# Patient Record
Sex: Female | Born: 1995 | Race: White | Hispanic: No | Marital: Single | Smoking: Never smoker
Health system: Southern US, Community
[De-identification: ages and names within clinical notes are randomized; demographics above are authoritative.]

---
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
## 2015-01-16 ENCOUNTER — Ambulatory Visit
Admission: RE | Admit: 2015-01-16 | Discharge: 2015-01-16 | Disposition: A | Discharge status: Home / Self Care | Payer: 59 | Source: Ambulatory Visit | Attending: Family Medicine | Admitting: Family Medicine

## 2015-01-16 ENCOUNTER — Other Ambulatory Visit: Payer: Self-pay | Admitting: Family Medicine

## 2015-01-16 DIAGNOSIS — M25511 Pain in right shoulder: Secondary | ICD-10-CM

## 2015-01-27 ENCOUNTER — Encounter: Payer: Self-pay | Admitting: Internal Medicine

## 2015-01-27 ENCOUNTER — Ambulatory Visit (INDEPENDENT_AMBULATORY_CARE_PROVIDER_SITE_OTHER): Payer: 59 | Admitting: Internal Medicine

## 2015-01-27 VITALS — BP 120/60 | HR 55 | Temp 97.8°F | Resp 12 | Ht 64.0 in | Wt 131.0 lb

## 2015-01-27 DIAGNOSIS — R946 Abnormal results of thyroid function studies: Secondary | ICD-10-CM

## 2015-01-27 DIAGNOSIS — E041 Nontoxic single thyroid nodule: Secondary | ICD-10-CM

## 2015-01-27 DIAGNOSIS — R9389 Abnormal findings on diagnostic imaging of other specified body structures: Secondary | ICD-10-CM

## 2015-01-27 DIAGNOSIS — E063 Autoimmune thyroiditis: Secondary | ICD-10-CM | POA: Insufficient documentation

## 2015-01-27 LAB — T4, FREE: Free T4: 1.07 ng/dL (ref 0.60–1.60)

## 2015-01-27 LAB — TSH: TSH: 2 u[IU]/mL (ref 0.40–5.00)

## 2015-01-27 LAB — T3, FREE: T3, Free: 3.6 pg/mL (ref 2.3–4.2)

## 2015-01-27 MED ORDER — LORAZEPAM 0.5 MG PO TABS: 0.5000 mg | ORAL_TABLET | Freq: Two times a day (BID) | ORAL | Status: DC | PRN

## 2015-01-27 NOTE — Progress Notes (Addendum)
Patient ID: Erica Forbes, female   DOB: [birth year], 19 y.o.   MRN: [id number]   HPI  Erica Forbes is a 26 y.o.-year-old female, referred by Dr Gerrit Friends, for management of a thyroid nodule and suspicion for thyroiditis (thyroid heterogeneity on the ultrasound).  Patient describes that last year she noted that any physical activity that increased her heart rate would cause pain in her neck and R shoulder >> saw Sports medicine >> chest CT angiogram in 12/2014 showed a right thyroid nodule.  Thyroid U/S (01/05/2014): 2.5 x 1.4 x 1.57 complex solid and cystic nodule in the lower pole with possible microcalcifications.  Thyroid nodule Bx (04/19/2014): benign  Thyroid U/S (10/06/2014): Dominant right-sided nodule at the inferior right thyroid measures 2.7 cm x 1.9 cm x 2.0 cm. Complex features with cystic and solid components. Heterogeneous appearance of L thyroid lobe.  Pt was seen by Dr Gerrit Friends but no surgery was indicated >> he referred the pt to endocrinology.  Pt denies feeling nodules in neck, hoarseness, dysphagia/odynophagia, SOB with lying down. She only feels right neck and shoulder pain when she exercises. She therefore reduced her activity level.  I reviewed pt's thyroid tests: Lab Results  Component Value Date   TSH 1.880 01/05/2014    Pt denies: - heat intolerance/cold intolerance - tremors - palpitations - anxiety/depression - hyperdefecation/constipation - weight loss - weight gain - dry skin - hair loss - problems with concentration - fatigue  Pt does not have a FH of thyroid ds or autoimmune ds. No FH of thyroid cancer. No h/o radiation tx to head or neck.  No seaweed or kelp, no recent contrast studies. No steroid use. No herbal supplements. Took Biotin in the past, not recently  I reviewed her chart and she also has a history of ear tubes as a child.  ROS: Constitutional: no weight gain/loss, no fatigue, no subjective hyperthermia/hypothermia Eyes: no  blurry vision, no xerophthalmia ENT: no sore throat, no nodules palpated in throat, no dysphagia/odynophagia, no hoarseness Cardiovascular: no CP/SOB/palpitations/leg swelling Respiratory: no cough/SOB Gastrointestinal: no N/V/D/C Musculoskeletal: no muscle/joint aches Skin: no rashes Neurological: no tremors/numbness/tingling/dizziness Psychiatric: no depression/anxiety  No past medical history.  Past surgical history: - Ear tubes as a child  Social History   Social History  . Marital Status: Single    Spouse Name: N/A  . Number of Children: 0   Occupational History  .  student at [employer]   Social History Main Topics  . Smoking status: Never Smoker   . Smokeless tobacco: Current User  . Alcohol Use: No  . Drug Use: No   Social History Narrative   Current Outpatient Rx  Name  Route  Sig  Dispense  Refill  . etonogestrel-ethinyl estradiol (NUVARING) 0.12-0.015 MG/24HR vaginal ring   Vaginal   Place 1 each vaginally every 28 (twenty-eight) days. Insert vaginally and leave in place for 3 consecutive weeks, then remove for 1 week.         No Known Allergies   Family history: - Skin cancer in grandmother, otherwise see history of present illness   PE: BP 120/60 mmHg  Pulse 55  Temp(Src) 97.8 F (36.6 C) (Oral)  Resp 12  Ht 5\' 4"  (1.626 m)  Wt 131 lb (59.421 kg)  BMI 22.47 kg/m2  SpO2 99%  LMP  Wt Readings from Last 3 Encounters:  01/27/15 131 lb (59.421 kg) (58 %*, Z = 0.20)  12/30/13 125 lb (56.7 kg) (52 %*, Z = 0.04)  12/23/13 127 lb 1.6 oz (57.652 kg) (56 %*, Z = 0.15)   * Growth percentiles are based on CDC 2-20 Years data.   Constitutional: Normal weight, in NAD Eyes: PERRLA, EOMI, no exophthalmos ENT: moist mucous membranes, + visible right thyroid nodule when patient swallows, + right thyroid fullness, no cervical lymphadenopathy Cardiovascular: RRR, No MRG Respiratory: CTA B Gastrointestinal: abdomen soft, NT, ND, BS+ Musculoskeletal: no  deformities, strength intact in all 4;  Skin: moist, warm, no rashes Neurological: no tremor with outstretched hands, DTR normal in all 4  ASSESSMENT: 1. Right thyroid nodule   2. 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 Right thyroid nodule  - I reviewed the images of her thyroid ultrasound along with the patient and her mother. I pointed out that the right nodule is large, and contains a large amount of fluid (colloid). Accumulation and resorption of fluid can cause fluctuation in the nodule size, and I believe that this is the reason why her nodule appears to have grown on the last ultrasound. Also, fluctuating fluid volumes can cause varying degrees of neck compression symptoms. Patient feels discomfort in the right side of her neck and shoulder when she exercises, and this may be related to the proximity of the nodule to the right internal carotid. - We discussed that the biopsy of the nodule is very assuring, as it returned benign, the appearance of the nodule on the ultrasound also suggests benignity and the fact that she does not have a thyroid cancer family history or a personal history of RxTx to head/neck would all favor benignity. Therefore, the indication for intervention is only depending on her level of discomfort. - The patient is bothered by her nodule as she gets anxious when she tries to exercise and starts having the right shoulder and neck pain. - In this case, I suggested that we aspirate the nodule and see if her symptoms improve. If he does, we can follow her without any other intervention for now. If the fluid re-forms, she may need a repeat aspiration, and may need ethanol injection in the future versus hemithyroidectomy. I did explain that, while thyroid surgery is not a complicated one, it still can have side effects and also she might have a risk of ~25% of becoming hypothyroid after hemithyroidectomy. She is definitely more interested in ethanol injection then  surgery, however, will need to find the center that performs this procedure nearby. - patient decided to have the the aspiration done now >> I ordered this. As she is anxious about needles, I gave her a few tablets of Ativan for the day of the procedure and the day before.  - I'll see her back in a year, assuming her symptoms resolve. If they reappear, we'll meet sooner. - I advised pt to join my chart and I will send her the results through there   2. Thyroid heterogeneity on ultrasound - I explained that this can be a sign of Hashimoto thyroiditis - Today we will check TFTs and TPO Abs - we discussed about Hashimoto thyroiditis and the fact that we would only treat this if her TFTs were abnormal  Office Visit on 01/27/2015  Component Date Value Ref Range Status  . TSH 01/27/2015 2.00  0.40 - 5.00 uIU/mL Final  . Free T4 01/27/2015 1.07  0.60 - 1.60 ng/dL Final  . T3, Free 16/02/9603 3.6  2.3 - 4.2 pg/mL Final  . Thyroperoxidase Ab SerPl-aCnc 01/27/2015 75* <9 IU/mL Final   TFTs  normal, but she has a new diagnosis of Hashimoto's thyroiditis.  CLINICAL DATA: 19 year old with a biopsy-proven benign right thyroid nodule. Patient has symptoms and pain in her right lower neck/ supraclavicular region during exercise. There is concern about mass effect from the thyroid nodule. Request for thyroid nodule aspiration to relieve symptoms.  EXAM: THYROID ULTRASOUND  TECHNIQUE: Ultrasound examination of the thyroid gland and adjacent soft tissues was performed.  COMPARISON: 10/05/2014  FINDINGS: Again noted is a complex nodule in the right thyroid lobe. There is only a small fluid component to this complex nodule. Central aspect of the nodule is vascular. Previously, the nodule measured up to 2.7 cm. The nodule appears to be slightly smaller, measuring roughly 2.3 cm in greatest diameter.  IMPRESSION: Right thyroid nodule is complex but contains only a small amount of fluid. I  discussed ultrasound-guided aspiration with the patient and her mother in depth. Based on the small amount of fluid in this complex nodule, I did not feel the patient would get therapeutic benefit from the aspiration. In fact, due to the vascularity of this nodule, I was concerned there may be increased hemorrhage in the nodule following an aspiration. As a result, ultrasound-guided aspiration was not performed.   Electronically Signed By: Richarda Overlie M.D. On: 03/07/2015 18:36  Thyroid nodule draining not performed - see above. The nodule is smaller now, off to the previous biopsy, at 2.3 cm. There is not a clear indication for surgery, but will discuss with the patient and see if she prefers to follow the nodule or prefers a more aggressive approach, with right lobectomy, depending on her level of discomfort.

## 2015-01-27 NOTE — Patient Instructions (Addendum)
Please schedule a thyroid aspiration.  Take 1-2 tablets of Ativan in the day of the biopsy.  Please return in 1 year.

## 2015-01-28 LAB — THYROID PEROXIDASE ANTIBODY: Thyroperoxidase Ab SerPl-aCnc: 75 IU/mL — ABNORMAL HIGH (ref <9)

## 2015-03-07 ENCOUNTER — Other Ambulatory Visit: Payer: Self-pay | Admitting: Internal Medicine

## 2015-03-07 ENCOUNTER — Ambulatory Visit
Admission: RE | Admit: 2015-03-07 | Discharge: 2015-03-07 | Disposition: A | Discharge status: Home / Self Care | Payer: 59 | Source: Ambulatory Visit | Attending: Internal Medicine | Admitting: Internal Medicine

## 2015-03-07 DIAGNOSIS — E041 Nontoxic single thyroid nodule: Secondary | ICD-10-CM

## 2015-03-08 ENCOUNTER — Telehealth: Payer: Self-pay | Admitting: Internal Medicine

## 2015-03-08 NOTE — Telephone Encounter (Signed)
Patient is returning your call.  

## 2015-03-09 ENCOUNTER — Encounter: Payer: Self-pay | Admitting: *Deleted

## 2015-03-09 NOTE — Telephone Encounter (Signed)
Spoke with pt and advised her per Dr Charlean SanfilippoGherghe's result note. Pt voiced understanding.

## 2015-11-22 ENCOUNTER — Other Ambulatory Visit: Payer: Self-pay | Admitting: Surgery

## 2015-11-22 DIAGNOSIS — E041 Nontoxic single thyroid nodule: Secondary | ICD-10-CM

## 2016-01-18 ENCOUNTER — Telehealth: Payer: Self-pay | Admitting: Internal Medicine

## 2016-01-18 ENCOUNTER — Other Ambulatory Visit: Payer: Self-pay

## 2016-01-18 DIAGNOSIS — E041 Nontoxic single thyroid nodule: Secondary | ICD-10-CM

## 2016-01-18 NOTE — Telephone Encounter (Signed)
Okay to order a TSH, free T4, free T3.

## 2016-01-18 NOTE — Telephone Encounter (Signed)
Ordered labs per MD. Jeanene Erballed to get patient an appointment to come get labs drawn as soon as she can before her 9/5 appointment. Lab orders are in.

## 2016-01-18 NOTE — Telephone Encounter (Signed)
Pt is having symptoms lethargic and really tired for the last 6 months and is experiencing dandruff and very dry skin  She has an appt on 9/5 but wants to know if maybe she could have labs before this appt

## 2016-01-23 ENCOUNTER — Other Ambulatory Visit (INDEPENDENT_AMBULATORY_CARE_PROVIDER_SITE_OTHER): Payer: 59

## 2016-01-23 DIAGNOSIS — E041 Nontoxic single thyroid nodule: Secondary | ICD-10-CM

## 2016-01-23 LAB — T3, FREE: T3, Free: 3.4 pg/mL (ref 2.3–4.2)

## 2016-01-23 LAB — T4, FREE: Free T4: 0.88 ng/dL (ref 0.60–1.60)

## 2016-01-23 LAB — TSH: TSH: 1.3 u[IU]/mL (ref 0.35–5.50)

## 2016-01-24 ENCOUNTER — Telehealth: Payer: Self-pay

## 2016-01-24 NOTE — Telephone Encounter (Signed)
Called and left message for patient advised of  Normal results, gave call back number if any issues

## 2016-01-30 ENCOUNTER — Encounter: Payer: Self-pay | Admitting: Internal Medicine

## 2016-01-30 ENCOUNTER — Ambulatory Visit (INDEPENDENT_AMBULATORY_CARE_PROVIDER_SITE_OTHER): Payer: 59 | Admitting: Internal Medicine

## 2016-01-30 VITALS — BP 98/68 | HR 92 | Ht 64.5 in | Wt 130.0 lb

## 2016-01-30 DIAGNOSIS — E041 Nontoxic single thyroid nodule: Secondary | ICD-10-CM

## 2016-01-30 DIAGNOSIS — E063 Autoimmune thyroiditis: Secondary | ICD-10-CM | POA: Diagnosis not present

## 2016-01-30 NOTE — Progress Notes (Signed)
Patient ID: Erica BrazenHannah M Langlais, female   DOB: 02-Nov-1995, 20 y.o.   MRN: [id number]   HPI  Erica Forbes is a 20 y.o.-year-old female, initially referred by Dr Gerrit FriendsGerkin, now returning for follow-up for a thyroid nodule and Hashimoto's thyroiditis.  Reviewed history: Patient described that in 2015 she noted that any physical activity that increased her heart rate would cause pain in her neck and R shoulder >> saw Sports medicine >> chest CT angiogram in 12/2014 showed a right thyroid nodule.  Thyroid U/S (01/05/2014): 2.5 x 1.4 x 1.57 complex solid and cystic nodule in the lower pole with possible microcalcifications.  Thyroid nodule Bx (04/19/2014): benign  Thyroid U/S (10/06/2014): Dominant right-sided nodule at the inferior right thyroid measures 2.7 cm x 1.9 cm x 2.0 cm. Complex features with cystic and solid components. Heterogeneous appearance of L thyroid lobe.  Pt was seen by Dr Gerrit FriendsGerkin but no surgery was indicated >> he referred the pt to endocrinology.  Thyroid U/S (03/07/2015): Complex right thyroid nodule, which appeared smaller in size (2.3 cm), and with less colloid compared to before. Therefore, another aspiration was not employed.  Pt denies feeling nodules in neck, hoarseness, dysphagia/odynophagia, SOB with lying down. She only feels right neck and shoulder pain when she exercises. She therefore reduced her activity level.  I reviewed pt's thyroid tests: Lab Results  Component Value Date   TSH 1.30 01/23/2016   TSH 2.00 01/27/2015   TSH 1.880 01/05/2014   FREET4 0.88 01/23/2016   FREET4 1.07 01/27/2015    Pt denies: - heat intolerance/cold intolerance - tremors - palpitations - anxiety/depression - hyperdefecation/constipation - weight loss - weight gain - dry skin - hair loss - problems with concentration  She c/o: - fatigue - dry scalp  Pt does not have a FH of thyroid ds or autoimmune ds. No FH of thyroid cancer. No h/o radiation tx to head or  neck.  No seaweed or kelp, no recent contrast studies. No steroid use. No herbal supplements. Took Biotin in the past, not recently  I reviewed her chart and she also has a history of ear tubes as a child.  She changed the OCPs 4 days ago.  ROS: Constitutional: no weight gain/loss, + fatigue, no subjective hyperthermia/hypothermia Eyes: no blurry vision, no xerophthalmia ENT: no sore throat, no nodules palpated in throat, no dysphagia/odynophagia, no hoarseness Cardiovascular: no CP/SOB/palpitations/leg swelling Respiratory: no cough/SOB Gastrointestinal: no N/V/D/C Musculoskeletal: no muscle/joint aches Skin: no rashes Neurological: no tremors/numbness/tingling/dizziness  I reviewed pt's medications, allergies, PMH, social hx, family hx, and changes were documented in the history of present illness. Otherwise, unchanged from my initial visit note.  No past medical history.  Past surgical history: - Ear tubes as a child  Social History   Social History  . Marital Status: Single    Spouse Name: N/A  . Number of Children: 0   Occupational History  .  student at [employer]   Social History Main Topics  . Smoking status: Never Smoker   . Smokeless tobacco: Current User  . Alcohol Use: No  . Drug Use: No   Social History Narrative   Current Outpatient Rx  Name  Route  Sig  Dispense  Refill  . etonogestrel-ethinyl estradiol (NUVARING) 0.12-0.015 MG/24HR vaginal ring   Vaginal   Place 1 each vaginally every 28 (twenty-eight) days. Insert vaginally and leave in place for 3 consecutive weeks, then remove for 1 week.         No  Known Allergies   Family history: - Skin cancer in grandmother, otherwise see history of present illness   PE: BP 98/68 (BP Location: Left Arm, Patient Position: Sitting)   Pulse 92   Ht 5' 4.5" (1.638 m)   Wt 130 lb (59 kg)   SpO2 97%   BMI 21.97 kg/m  Wt Readings from Last 3 Encounters:  01/30/16 130 lb (59 kg)  01/27/15 131 lb  (59.4 kg) (58 %, Z= 0.20)*  12/30/13 125 lb (56.7 kg) (52 %, Z= 0.04)*   * Growth percentiles are based on CDC 2-20 Years data.   Constitutional: Normal weight, in NAD Eyes: PERRLA, EOMI, no exophthalmos ENT: moist mucous membranes, + visible right thyroid nodule when patient swallows, + right thyroid fullness, no cervical lymphadenopathy Cardiovascular: RRR, No MRG Respiratory: CTA B Gastrointestinal: abdomen soft, NT, ND, BS+ Musculoskeletal: no deformities, strength intact in all 4;  Skin: moist, warm, no rashes Neurological: no tremor with outstretched hands, DTR normal in all 4  ASSESSMENT: 1. Right thyroid nodule   2. Hashimoto's thyroiditis   PLAN: 1. Right thyroid nodule  - I reviewed the reports of her latest to thyroid ultrasounds along with the patient and her mother. I pointed out that this is a complex nodule, in which accumulation and resorption of fluid can cause fluctuation in the nodule size.Fluctuating fluid volumes can cause varying degrees of neck compression symptoms. Patient feels discomfort in the right side of her neck and shoulder when she exercises, and this may be related to the proximity of the nodule to the right internal carotid. - We discussed that the biopsy of the nodule is very assuring, as it returned benign, the appearance of the nodule on the ultrasound also suggests benignity and the fact that she does not have a thyroid cancer family history or a personal history of RxTx to head/neck would all favor benignity. Therefore, the indication for intervention is only depending on her level of discomfort. - The patient is bothered by her nodule when she tries to exercise and starts having the right shoulder and neck pain.  - We could not repeat the aspiration last year as there was not enough colloid, and therefore ethanol injection in the nodule would not help. She would like to avoid hemithyroidectomy. She and her mom tell me they have researched different  modalities to shrink thyroid nodules w/o surgery and would like to pursue Echo therapy (thermal U/S ablation). I will research this to see where this technique is done. I will let them know. - I'll see her back in a year, assuming her symptoms resolve. If they reappear, we'll meet sooner. - I advised pt to join my chart and I will send her the results through there   2.  Hashimoto thyroiditis - First detected test heterogeneity on thyroid ultrasound - TPO Abs were also found to be elevated - This is euthyroid for now with no need for levothyroxine  - She had recent TFTs that were normal  Carlus Pavlov, MD PhD [facility]

## 2016-01-30 NOTE — Patient Instructions (Signed)
Please return in 1 year. 

## 2016-11-25 ENCOUNTER — Emergency Department (HOSPITAL_COMMUNITY)
Admission: EM | Admit: 2016-11-25 | Discharge: 2016-11-26 | Disposition: A | Discharge status: Home / Self Care | Payer: BLUE CROSS/BLUE SHIELD | Attending: Emergency Medicine | Admitting: Emergency Medicine

## 2016-11-25 ENCOUNTER — Encounter (HOSPITAL_COMMUNITY): Payer: Self-pay | Admitting: Emergency Medicine

## 2016-11-25 ENCOUNTER — Emergency Department (HOSPITAL_COMMUNITY): Payer: BLUE CROSS/BLUE SHIELD

## 2016-11-25 DIAGNOSIS — S0003XA Contusion of scalp, initial encounter: Secondary | ICD-10-CM | POA: Diagnosis not present

## 2016-11-25 DIAGNOSIS — S02651A Fracture of angle of right mandible, initial encounter for closed fracture: Secondary | ICD-10-CM

## 2016-11-25 DIAGNOSIS — T07XXXA Unspecified multiple injuries, initial encounter: Secondary | ICD-10-CM | POA: Diagnosis not present

## 2016-11-25 DIAGNOSIS — R2231 Localized swelling, mass and lump, right upper limb: Secondary | ICD-10-CM | POA: Diagnosis not present

## 2016-11-25 DIAGNOSIS — Y939 Activity, unspecified: Secondary | ICD-10-CM | POA: Insufficient documentation

## 2016-11-25 DIAGNOSIS — Y999 Unspecified external cause status: Secondary | ICD-10-CM | POA: Diagnosis not present

## 2016-11-25 DIAGNOSIS — Y929 Unspecified place or not applicable: Secondary | ICD-10-CM | POA: Diagnosis not present

## 2016-11-25 DIAGNOSIS — R6884 Jaw pain: Secondary | ICD-10-CM | POA: Diagnosis not present

## 2016-11-25 LAB — POC URINE PREG, ED: PREG TEST UR: NEGATIVE

## 2016-11-25 MED ORDER — OXYCODONE-ACETAMINOPHEN 5-325 MG PO TABS
ORAL_TABLET | ORAL | Status: AC
  Filled 2016-11-25: qty 1

## 2016-11-25 MED ORDER — OXYCODONE-ACETAMINOPHEN 5-325 MG PO TABS
1.0000 | ORAL_TABLET | Freq: Once | ORAL | Status: AC
  Administered 2016-11-25: 1 via ORAL
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
## 2016-11-26 MED ORDER — CEPHALEXIN 250 MG PO CAPS
500.0000 mg | ORAL_CAPSULE | Freq: Once | ORAL | Status: AC
  Administered 2016-11-26: 500 mg via ORAL
  Filled 2016-11-26: qty 2

## 2016-11-26 MED ORDER — CEPHALEXIN 500 MG PO CAPS: 500.0000 mg | ORAL_CAPSULE | Freq: Three times a day (TID) | ORAL | 0 refills | Status: DC

## 2016-11-26 MED ORDER — IBUPROFEN 100 MG/5ML PO SUSP
400.0000 mg | Freq: Once | ORAL | Status: AC
  Administered 2016-11-26: 400 mg via ORAL
  Filled 2016-11-26: qty 20

## 2016-11-26 MED ORDER — HYDROCODONE-ACETAMINOPHEN 5-325 MG PO TABS: 1.0000 | ORAL_TABLET | Freq: Four times a day (QID) | ORAL | 0 refills | Status: DC | PRN

## 2016-11-26 MED ORDER — DICLOFENAC SODIUM 50 MG PO TBEC: 50.0000 mg | DELAYED_RELEASE_TABLET | Freq: Two times a day (BID) | ORAL | 0 refills | Status: DC

## 2016-11-26 NOTE — ED Notes (Signed)
PT states understanding of care given, follow up care, and medication prescribed. PT ambulated from ED to car with a steady gait. 

## 2016-11-26 NOTE — Discharge Instructions (Signed)
Call Dr. Maude Lerichehimmappa's office for follow up. Return here as needed.

## 2017-01-29 ENCOUNTER — Ambulatory Visit: Payer: Self-pay | Admitting: Internal Medicine

## 2017-03-20 ENCOUNTER — Encounter: Payer: Self-pay | Admitting: Internal Medicine

## 2017-03-20 ENCOUNTER — Ambulatory Visit (INDEPENDENT_AMBULATORY_CARE_PROVIDER_SITE_OTHER): Payer: BLUE CROSS/BLUE SHIELD | Admitting: Internal Medicine

## 2017-03-20 VITALS — BP 112/70 | HR 73 | Temp 98.0°F | Ht 64.5 in | Wt 133.0 lb

## 2017-03-20 DIAGNOSIS — E041 Nontoxic single thyroid nodule: Secondary | ICD-10-CM | POA: Diagnosis not present

## 2017-03-20 DIAGNOSIS — E063 Autoimmune thyroiditis: Secondary | ICD-10-CM

## 2017-03-20 LAB — TSH: TSH: 1.08 u[IU]/mL (ref 0.35–4.50)

## 2017-03-20 LAB — T3, FREE: T3, Free: 3.3 pg/mL (ref 2.3–4.2)

## 2017-03-20 LAB — T4, FREE: FREE T4: 0.84 ng/dL (ref 0.60–1.60)

## 2017-03-20 NOTE — Progress Notes (Signed)
Patient ID: Erica Forbes, female   DOB: 12-21-1995, 21 y.o.   MRN: [id number]   HPI  Erica Forbes is a 21 y.o.-year-old female, initially referred by Dr Erica Forbes, now returning for follow-up for a thyroid nodule and Hashimoto's thyroiditis. Last visit a year ago.  She had a jaw fx 11/2016.  She is under a lot of stress with school and work >> increased anxiety, palpitations, acne, bursts in tears. On OCPs for dysmenorrhea and acne.   Reviewed and addended history: Patient described that in 2015 she noted that any physical activity that increased her heart rate would cause pain in her neck and R shoulder >> saw Sports medicine >> chest CT angiogram in 12/2014 showed a right thyroid nodule.  Thyroid U/S (01/05/2014): 2.5 x 1.4 x 1.57 complex solid and cystic nodule in the lower pole with possible microcalcifications.  Thyroid nodule Bx (04/19/2014): benign  Thyroid U/S (10/06/2014): Dominant right-sided nodule at the inferior right thyroid measures 2.7 cm x 1.9 cm x 2.0 cm. Complex features with cystic and solid components. Heterogeneous appearance of L thyroid lobe.  Pt was seen by Dr Erica Forbes but no surgery was indicated >> he referred the pt to endocrinology.  Thyroid U/S (03/07/2015): Complex right thyroid nodule, which appeared smaller in size (2.3 cm), and with less colloid compared to before. Therefore, another aspiration was not employed.  Neck CT scan (11/25/2016): 1.7 cm hypodensity in the right thyroid lobe  I reviewed pt's thyroid tests: Lab Results  Component Value Date   TSH 1.30 01/23/2016   TSH 2.00 01/27/2015   TSH 1.880 01/05/2014   FREET4 0.88 01/23/2016   FREET4 1.07 01/27/2015    Component     Latest Ref Rng & Units 01/27/2015  Thyroperoxidase Ab SerPl-aCnc     <9 IU/mL 75 (H)   Pt denies: - feeling nodules in neck >> she is not feeling the R sided nodule anymore - hoarseness - dysphagia - choking - SOB with lying down  Pt does not have a FH of  thyroid ds or autoimmune ds. No FH of thyroid cancer. No h/o radiation tx to head or neck.  No seaweed or kelp. No recent contrast studies. No herbal supplements. No Biotin use. No recent steroids use.   ROS: Constitutional: no weight gain/no weight loss, no fatigue, no subjective hyperthermia, no subjective hypothermia Eyes: no blurry vision, no xerophthalmia ENT: no sore throat, + see HPI Cardiovascular: no CP/+ SOB/+ palpitations/no leg swelling Respiratory: no cough/+ SOB/no wheezing Gastrointestinal: no N/no V/no D/no C/no acid reflux Musculoskeletal: no muscle aches/no joint aches Skin: no rashes, no hair loss, + acne chin Neurological: no tremors/no numbness/no tingling/no dizziness  I reviewed pt's medications, allergies, PMH, social hx, family hx, and changes were documented in the history of present illness. Otherwise, unchanged from my initial visit note. No past medical history.  Past surgical history: - Ear tubes as a child  Social History   Social History  . Marital Status: Single    Spouse Name: N/A  . Number of Children: 0   Occupational History  .  student at [employer]   Social History Main Topics  . Smoking status: Never Smoker   . Smokeless tobacco: Current User  . Alcohol Use: No  . Drug Use: No   Social History Narrative   Current Outpatient Rx  Name  Route  Sig  Dispense  Refill  . etonogestrel-ethinyl estradiol (NUVARING) 0.12-0.015 MG/24HR vaginal ring   Vaginal   Place  1 each vaginally every 28 (twenty-eight) days. Insert vaginally and leave in place for 3 consecutive weeks, then remove for 1 week.         No Known Allergies   Family history: - Skin cancer in grandmother, otherwise see history of present illness   PE: BP 112/70   Pulse 73   Temp 98 F (36.7 C) (Oral)   Ht 5' 4.5" (1.638 m)   Wt 133 lb (60.3 kg)   SpO2 100%   BMI 22.48 kg/m  Wt Readings from Last 3 Encounters:  03/20/17 133 lb (60.3 kg)  01/30/16 130 lb (59  kg)  01/27/15 131 lb (59.4 kg) (58 %, Z= 0.20)*   * Growth percentiles are based on CDC 2-20 Years data.   Constitutional: normal weight, in NAD Eyes: PERRLA, EOMI, no exophthalmos ENT: moist mucous membranes, + visible right thyroid nodule when patient swallows, + R thyroid fulness, no cervical lymphadenopathy Cardiovascular: RRR, No MRG Respiratory: CTA B Gastrointestinal: abdomen soft, NT, ND, BS+ Musculoskeletal: no deformities, strength intact in all 4 Skin: moist, warm, no rashes Neurological: no tremor with outstretched hands, DTR normal in all 4  ASSESSMENT: 1. Right thyroid nodule   2. Hashimoto's thyroiditis   PLAN: 1. Right thyroid nodule  - Reviewed the reports of her latest thyroid ultrasounds and also of the recent neck CT along with the patient. The dimensions of the nodule on the new CT scan are smaller (1.7 cm) compared to previous reports per ultrasound (more than 2 cm), however, I explained that while the 2 types of tests do not have the same accuracy in describing thyroid nodules, it is clear that the nodule did not increase. Also, she tells me that she is not feeling it anymore and is not bothered by the nodule when she exercises. - We will skip the ultrasound this year and will continue to follow the nodule clinically especially since the previous biopsy was benign. We may need to repeat an ultrasound in a year or 2.  2.  Hashimoto thyroiditis - First detected as heterogeneity on thyroid ultrasound, then, TPO antibodies were also found to be elevated. - she is euthyroid per latest TFTs obtained 1 year ago, but does complain of increased anxiety and she has also increased fatigue, acne, palpitations. - We will recheck her TFTs today but I also suggested to establish an appointment with a dermatologist to help with her acne. I do suspect that her anxiety and palpitations may be related to her increased stress from school and work.  Office Visit on 03/20/2017   Component Date Value Ref Range Status  . TSH 03/20/2017 1.08  0.35 - 4.50 uIU/mL Final  . Free T4 03/20/2017 0.84  0.60 - 1.60 ng/dL Final   Comment: Specimens from patients who are undergoing biotin therapy and /or ingesting biotin supplements may contain high levels of biotin.  The higher biotin concentration in these specimens interferes with this Free T4 assay.  Specimens that contain high levels  of biotin may cause false high results for this Free T4 assay.  Please interpret results in light of the total clinical presentation of the patient.    . T3, Free 03/20/2017 3.3  2.3 - 4.2 pg/mL Final   Normal TFTs.  Carlus Pavlovristina Tametra Ahart, MD PhD [facility]

## 2017-03-20 NOTE — Patient Instructions (Signed)
Please stop at the lab.  Please come back for a follow-up appointment in 1 year.  

## 2017-04-03 DIAGNOSIS — L710 Perioral dermatitis: Secondary | ICD-10-CM | POA: Diagnosis not present

## 2017-04-03 DIAGNOSIS — L219 Seborrheic dermatitis, unspecified: Secondary | ICD-10-CM | POA: Diagnosis not present

## 2017-04-03 DIAGNOSIS — L812 Freckles: Secondary | ICD-10-CM | POA: Diagnosis not present

## 2017-04-03 DIAGNOSIS — D229 Melanocytic nevi, unspecified: Secondary | ICD-10-CM | POA: Diagnosis not present

## 2017-04-16 ENCOUNTER — Other Ambulatory Visit (HOSPITAL_COMMUNITY)
Admission: RE | Admit: 2017-04-16 | Discharge: 2017-04-16 | Disposition: A | Discharge status: Home / Self Care | Payer: BLUE CROSS/BLUE SHIELD | Source: Ambulatory Visit | Attending: Family Medicine | Admitting: Family Medicine

## 2017-04-16 ENCOUNTER — Other Ambulatory Visit: Payer: Self-pay | Admitting: Family Medicine

## 2017-04-16 DIAGNOSIS — Z01411 Encounter for gynecological examination (general) (routine) with abnormal findings: Secondary | ICD-10-CM | POA: Insufficient documentation

## 2017-04-16 DIAGNOSIS — E041 Nontoxic single thyroid nodule: Secondary | ICD-10-CM | POA: Diagnosis not present

## 2017-04-16 DIAGNOSIS — Z0000 Encounter for general adult medical examination without abnormal findings: Secondary | ICD-10-CM | POA: Diagnosis not present

## 2017-04-16 DIAGNOSIS — Z124 Encounter for screening for malignant neoplasm of cervix: Secondary | ICD-10-CM | POA: Diagnosis not present

## 2017-04-22 LAB — CYTOLOGY - PAP
DIAGNOSIS: NEGATIVE
HPV: NOT DETECTED

## 2017-06-04 DIAGNOSIS — L710 Perioral dermatitis: Secondary | ICD-10-CM | POA: Diagnosis not present

## 2017-06-04 DIAGNOSIS — L219 Seborrheic dermatitis, unspecified: Secondary | ICD-10-CM | POA: Diagnosis not present

## 2017-06-04 DIAGNOSIS — S31819A Unspecified open wound of right buttock, initial encounter: Secondary | ICD-10-CM | POA: Diagnosis not present

## 2017-08-06 DIAGNOSIS — L219 Seborrheic dermatitis, unspecified: Secondary | ICD-10-CM | POA: Diagnosis not present

## 2017-08-06 DIAGNOSIS — L700 Acne vulgaris: Secondary | ICD-10-CM | POA: Diagnosis not present

## 2018-01-01 DIAGNOSIS — L219 Seborrheic dermatitis, unspecified: Secondary | ICD-10-CM | POA: Diagnosis not present

## 2018-01-01 DIAGNOSIS — L719 Rosacea, unspecified: Secondary | ICD-10-CM | POA: Diagnosis not present

## 2018-01-01 DIAGNOSIS — L700 Acne vulgaris: Secondary | ICD-10-CM | POA: Diagnosis not present

## 2018-01-01 DIAGNOSIS — Z79899 Other long term (current) drug therapy: Secondary | ICD-10-CM | POA: Diagnosis not present

## 2018-01-29 DIAGNOSIS — D2230 Melanocytic nevi of unspecified part of face: Secondary | ICD-10-CM | POA: Diagnosis not present

## 2018-01-29 DIAGNOSIS — D2270 Melanocytic nevi of unspecified lower limb, including hip: Secondary | ICD-10-CM | POA: Diagnosis not present

## 2018-01-29 DIAGNOSIS — L700 Acne vulgaris: Secondary | ICD-10-CM | POA: Diagnosis not present

## 2018-01-29 DIAGNOSIS — D2260 Melanocytic nevi of unspecified upper limb, including shoulder: Secondary | ICD-10-CM | POA: Diagnosis not present

## 2018-01-30 DIAGNOSIS — Z6823 Body mass index (BMI) 23.0-23.9, adult: Secondary | ICD-10-CM | POA: Diagnosis not present

## 2018-01-30 DIAGNOSIS — Z01419 Encounter for gynecological examination (general) (routine) without abnormal findings: Secondary | ICD-10-CM | POA: Diagnosis not present

## 2018-03-23 ENCOUNTER — Ambulatory Visit (INDEPENDENT_AMBULATORY_CARE_PROVIDER_SITE_OTHER): Payer: BLUE CROSS/BLUE SHIELD | Admitting: Internal Medicine

## 2018-03-23 ENCOUNTER — Encounter: Payer: Self-pay | Admitting: Internal Medicine

## 2018-03-23 VITALS — BP 102/60 | HR 84 | Ht 64.5 in | Wt 136.0 lb

## 2018-03-23 DIAGNOSIS — E063 Autoimmune thyroiditis: Secondary | ICD-10-CM

## 2018-03-23 DIAGNOSIS — E041 Nontoxic single thyroid nodule: Secondary | ICD-10-CM

## 2018-03-23 LAB — T4, FREE: Free T4: 0.81 ng/dL (ref 0.60–1.60)

## 2018-03-23 LAB — T3, FREE: T3, Free: 3.2 pg/mL (ref 2.3–4.2)

## 2018-03-23 LAB — TSH: TSH: 1.4 u[IU]/mL (ref 0.35–4.50)

## 2018-03-23 NOTE — Progress Notes (Signed)
Patient ID: Erica Forbes, female   DOB: 04/09/1996, 22 y.o.   MRN: [id number]   HPI  Erica Forbes is a 22 y.o.-year-old female, initially referred by Dr Gerrit Friends, now returning for follow-up for a thyroid nodule and Hashimoto's thyroiditis. Last visit 1 year ago.  Since last visit, she is feeling better, w/o anxiety, palpitations, acne - after she changed her OCPs from to [facility] >> [facility].  Reviewed history: Patient described that in 2015 she noted that any physical activity that increased her heart rate would cause pain in her neck and R shoulder >> saw Sports medicine >> chest CT angiogram in 12/2014 showed a right thyroid nodule.  Thyroid U/S (01/05/2014): 2.5 x 1.4 x 1.57 complex solid and cystic nodule in the lower pole with possible microcalcifications.  Thyroid nodule Bx (04/19/2014): benign  Thyroid U/S (10/06/2014): Dominant right-sided nodule at the inferior right thyroid measures 2.7 cm x 1.9 cm x 2.0 cm. Complex features with cystic and solid components. Heterogeneous appearance of L thyroid lobe.  Pt was seen by Dr Gerrit Friends but no surgery was indicated >> he referred the pt to endocrinology.  Thyroid U/S (03/07/2015): Complex right thyroid nodule, which appeared smaller in size (2.3 cm), and with less colloid compared to before. Therefore, another aspiration was not employed.  Neck CT scan (11/25/2016): 1.7 cm hypodensity in the right thyroid lobe  Reviewed patient's TFTs and they were all normal: Lab Results  Component Value Date   TSH 1.08 03/20/2017   TSH 1.30 01/23/2016   TSH 2.00 01/27/2015   TSH 1.880 01/05/2014   FREET4 0.84 03/20/2017   FREET4 0.88 01/23/2016   FREET4 1.07 01/27/2015    TPO antibodies were elevated: Component     Latest Ref Rng & Units 01/27/2015  Thyroperoxidase Ab SerPl-aCnc     <9 IU/mL 75 (H)   Pt denies: - feeling nodules in neck - hoarseness - dysphagia - choking - SOB with lying down  Pt does not have a FH of thyroid  ds or autoimmune ds. No FH of thyroid cancer. No h/o radiation tx to head or neck.  No seaweed or kelp. No recent contrast studies. No herbal supplements. No Biotin use. No recent steroids use.   She had a jaw fx 11/2016.  On OCPs for dysmenorrhea and acne.   ROS: Constitutional: no weight gain/no weight loss, no fatigue, no subjective hyperthermia, no subjective hypothermia Eyes: no blurry vision, no xerophthalmia ENT: no sore throat, + see HPI Cardiovascular: no CP/no SOB/no palpitations/no leg swelling Respiratory: no cough/no SOB/no wheezing Gastrointestinal: no N/no V/no D/no C/no acid reflux Musculoskeletal: no muscle aches/no joint aches Skin: no rashes, no hair loss Neurological: no tremors/no numbness/no tingling/no dizziness  I reviewed pt's medications, allergies, PMH, social hx, family hx, and changes were documented in the history of present illness. Otherwise, unchanged from my initial visit note.  Past surgical history: - Ear tubes as a child  Social History   Social History  . Marital Status: Single    Spouse Name: N/A  . Number of Children: 0   Occupational History  .  student at [employer]   Social History Main Topics  . Smoking status: Never Smoker   . Smokeless tobacco: Current User  . Alcohol Use: No  . Drug Use: No   Social History Narrative  No Known Allergies   Current Outpatient Medications  Medication Sig Dispense Refill  . cephALEXin (KEFLEX) 500 MG capsule Take 1 capsule (500 mg total) by  mouth 3 (three) times daily. 20 capsule 0  . diclofenac (VOLTAREN) 50 MG EC tablet Take 1 tablet (50 mg total) by mouth 2 (two) times daily. 15 tablet 0  . HYDROcodone-acetaminophen (NORCO) 5-325 MG tablet Take 1 tablet by mouth every 6 (six) hours as needed. 15 tablet 0  . LORazepam (ATIVAN) 0.5 MG tablet Take 1 tablet (0.5 mg total) by mouth 2 (two) times daily as needed for anxiety. 4 tablet 0  . norgestimate-ethinyl estradiol  (ORTHO-CYCLEN,SPRINTEC,PREVIFEM) 0.25-35 MG-MCG tablet      No current facility-administered medications for this visit.    Family history: - Skin cancer in grandmother, otherwise see history of present illness   PE: BP 102/60   Pulse 84   Ht 5' 4.5" (1.638 m) Comment: measured  Wt 136 lb (61.7 kg)   SpO2 99%   BMI 22.98 kg/m  Wt Readings from Last 3 Encounters:  03/23/18 136 lb (61.7 kg)  03/20/17 133 lb (60.3 kg)  01/30/16 130 lb (59 kg)   Constitutional: normalweight, in NAD Eyes: PERRLA, EOMI, no exophthalmos ENT: moist mucous membranes, + visible right thyroid nodule when patient swallows, + right thyroid fullness, no cervical lymphadenopathy Cardiovascular: RRR, No MRG Respiratory: CTA B Gastrointestinal: abdomen soft, NT, ND, BS+ Musculoskeletal: no deformities, strength intact in all 4 Skin: moist, warm, no rashes Neurological: no tremor with outstretched hands, DTR normal in all 4  ASSESSMENT: 1. Right thyroid nodule   2. Hashimoto's thyroiditis   PLAN: 1. Right thyroid nodule  - we reviewed together the reports of her imaging tests: Ultrasounds and also neck CT along with the patient.  The dimensions of the nodule on the latest CT scan were smaller (1.7 cm) compared to previous ultrasound reports (> 2 cm) however, the 2 types of tests do not have the same kerasin describing thyroid nodules, however, it is clear that the nodule did not increase.  Also, she is not feeling it anymore but still feels the nodule when she exercises. - will recheck her ultrasound now  2.  Hashimoto thyroiditis -This was first detected as heterogenicity on thyroid ultrasound and then TPO antibodies were found to be elevated -She is euthyroid per latest TFTs obtained 1 year ago -At last visit, she had a lot of stress at school and work and did complain of increased anxiety, fatigue, palpitations, acne.  TFTs were normal, though.  Since then, the symptoms resolved after changing her  OCPs -Recheck TFTs today -If normal, no intervention is needed.  Reading Physician Reading Date Result Priority  Malachy Moan, MD 03/26/2018     Narrative    CLINICAL DATA: Goiter. 22 year old female with a history of thyroid nodules. She has previously undergone biopsy the right-sided thyroid nodule on 04/19/2014  EXAM: THYROID ULTRASOUND  TECHNIQUE: Ultrasound examination of the thyroid gland and adjacent soft tissues was performed.  COMPARISON: Prior thyroid ultrasound 03/07/2015 and 01/05/2014  FINDINGS: Parenchymal Echotexture: Mildly heterogenous  Isthmus: 0.3 cm  Right lobe: 5.8 x 2.1 x 2.5 cm  Left lobe: 5.1 x 1.1 x 1.3 cm  _________________________________________________________  Estimated total number of nodules >/= 1 cm: 2  Number of spongiform nodules >/= 2 cm not described below (TR1): 0  Number of mixed cystic and solid nodules >/= 1.5 cm not described below (TR2): 0  _________________________________________________________  The previously biopsied nodule in the right inferior gland is essentially unchanged at 2.8 x 1.9 x 2.2 cm compared to 2.5 x 1.4 x 1.6 cm in August of 2015 and  2.7 x 1.9 x 2.0 cm in October of 2016.  Additional benign spongiform nodule present in the left mid gland. Small subcentimeter abutting thyroid nodules are present in the right inferior gland just below the previously biopsied nodule.  IMPRESSION: Stable previously biopsied nodule in the right inferior gland. Recommend correlation with prior biopsy results.  Additional nodules in the left mid and right inferior gland do not require further follow-up.  The above is in keeping with the ACR TI-RADS recommendations - J Am Coll Radiol 2017;14:587-595.   No significant changes in her thyroid nodules.  Carlus Pavlov, MD PhD [facility]

## 2018-03-23 NOTE — Patient Instructions (Signed)
We will order a new thyroid U/S.  Please stop at the lab.  Please come back for a follow-up appointment in 1 year.

## 2018-03-26 ENCOUNTER — Ambulatory Visit
Admission: RE | Admit: 2018-03-26 | Discharge: 2018-03-26 | Disposition: A | Discharge status: Home / Self Care | Payer: BLUE CROSS/BLUE SHIELD | Source: Ambulatory Visit | Attending: Internal Medicine | Admitting: Internal Medicine

## 2018-03-26 DIAGNOSIS — E049 Nontoxic goiter, unspecified: Secondary | ICD-10-CM | POA: Diagnosis not present

## 2018-03-26 DIAGNOSIS — E041 Nontoxic single thyroid nodule: Secondary | ICD-10-CM

## 2018-04-03 ENCOUNTER — Encounter: Payer: Self-pay | Admitting: Internal Medicine

## 2018-04-13 DIAGNOSIS — Z111 Encounter for screening for respiratory tuberculosis: Secondary | ICD-10-CM | POA: Diagnosis not present

## 2018-04-13 DIAGNOSIS — Z309 Encounter for contraceptive management, unspecified: Secondary | ICD-10-CM | POA: Diagnosis not present

## 2018-04-17 DIAGNOSIS — Z3009 Encounter for other general counseling and advice on contraception: Secondary | ICD-10-CM | POA: Diagnosis not present

## 2018-04-17 DIAGNOSIS — L709 Acne, unspecified: Secondary | ICD-10-CM | POA: Diagnosis not present

## 2018-05-04 DIAGNOSIS — Z30430 Encounter for insertion of intrauterine contraceptive device: Secondary | ICD-10-CM | POA: Diagnosis not present

## 2018-06-23 DIAGNOSIS — Z30431 Encounter for routine checking of intrauterine contraceptive device: Secondary | ICD-10-CM | POA: Diagnosis not present

## 2018-07-20 DIAGNOSIS — J111 Influenza due to unidentified influenza virus with other respiratory manifestations: Secondary | ICD-10-CM | POA: Diagnosis not present

## 2018-07-20 DIAGNOSIS — J069 Acute upper respiratory infection, unspecified: Secondary | ICD-10-CM | POA: Diagnosis not present

## 2019-03-22 ENCOUNTER — Other Ambulatory Visit: Payer: Self-pay

## 2019-03-24 ENCOUNTER — Other Ambulatory Visit: Payer: Self-pay

## 2019-03-24 ENCOUNTER — Ambulatory Visit (INDEPENDENT_AMBULATORY_CARE_PROVIDER_SITE_OTHER): Payer: BC Managed Care – PPO | Admitting: Internal Medicine

## 2019-03-24 ENCOUNTER — Encounter: Payer: Self-pay | Admitting: Internal Medicine

## 2019-03-24 VITALS — BP 100/60 | HR 75 | Ht 64.5 in | Wt 134.0 lb

## 2019-03-24 DIAGNOSIS — E063 Autoimmune thyroiditis: Secondary | ICD-10-CM | POA: Diagnosis not present

## 2019-03-24 DIAGNOSIS — E041 Nontoxic single thyroid nodule: Secondary | ICD-10-CM

## 2019-03-24 NOTE — Patient Instructions (Signed)
Please stop at the lab.  Please come back for a follow-up appointment in 1 year.  

## 2019-03-24 NOTE — Progress Notes (Signed)
Patient ID: Erica [employer], female   DOB: 1995/12/07, 23 y.o.   MRN: [id number]   HPI  KAISHA [employer] is a 23 y.o.-year-old female, initially referred by Dr Gerrit Friends, now returning for follow-up for a thyroid nodule and Hashimoto's thyroiditis. Last visit 1 year ago.  Reviewed and addended history: Patient described that in 2015 she noted that any physical activity that increased her heart rate would cause pain in her neck and R shoulder >> saw Sports medicine >> chest CT angiogram in 12/2014 showed a right thyroid nodule.  Thyroid U/S (01/05/2014): 2.5 x 1.4 x 1.57 complex solid and cystic nodule in the lower pole with possible microcalcifications.  Thyroid nodule Bx (04/19/2014): benign  Thyroid U/S (10/06/2014): Dominant right-sided nodule at the inferior right thyroid measures 2.7 cm x 1.9 cm x 2.0 cm. Complex features with cystic and solid components. Heterogeneous appearance of L thyroid lobe.  Pt was seen by Dr Gerrit Friends but no surgery was indicated >> he referred the pt to endocrinology.  Thyroid U/S (03/07/2015): Complex right thyroid nodule, which appeared smaller in size (2.3 cm), and with less colloid compared to before. Therefore, another aspiration was not employed.  Neck CT scan (11/25/2016): 1.7 cm hypodensity in the right thyroid lobe  Thyroid U/S (03/26/2018): Stable dominant nodule:  The previously biopsied nodule in the right inferior gland is essentially unchanged at 2.8 x 1.9 x 2.2 cm compared to 2.5 x 1.4 x 1.6 cm in August of 2015 and 2.7 x 1.9 x 2.0 cm in October of 2016.  Additional benign spongiform nodule present in the left mid gland.  Small subcentimeter abutting thyroid nodules are present in the right inferior gland just below the previously biopsied nodule.  IMPRESSION: Stable previously biopsied nodule in the right inferior gland. Recommend correlation with prior biopsy results. Additional nodules in the left mid and right inferior gland do  not require further follow-up.  Her TFTs remained normal Lab Results  Component Value Date   TSH 1.40 03/23/2018   TSH 1.08 03/20/2017   TSH 1.30 01/23/2016   TSH 2.00 01/27/2015   TSH 1.880 01/05/2014   FREET4 0.81 03/23/2018   FREET4 0.84 03/20/2017   FREET4 0.88 01/23/2016   FREET4 1.07 01/27/2015    She does have a diagnosis of Hashimoto's thyroiditis: Component     Latest Ref Rng & Units 01/27/2015  Thyroperoxidase Ab SerPl-aCnc     <9 IU/mL 75 (H)   Pt denies: - feeling nodules in neck - hoarseness - dysphagia - choking - SOB with lying down  Pt does not have a FH of thyroid ds or autoimmune ds. No FH of thyroid cancer. No h/o radiation tx to head or neck.  No seaweed or kelp. No recent contrast studies. No herbal supplements. No Biotin use. No recent steroids use.   She had a jaw fx 11/2016.  Prev. OCPs for dysmenorrhea and acne >> now IUD.  She graduated in 09/2018 and now has a new job in Chief Financial Officer, in [place].   ROS: Constitutional: no weight gain/no weight loss, no fatigue, no subjective hyperthermia, no subjective hypothermia Eyes: no blurry vision, no xerophthalmia ENT: no sore throat, + see HPI Cardiovascular: no CP/no SOB/no palpitations/no leg swelling Respiratory: no cough/no SOB/no wheezing Gastrointestinal: no N/no V/no D/no C/no acid reflux Musculoskeletal: no muscle aches/no joint aches Skin: no rashes, no hair loss Neurological: no tremors/no numbness/no tingling/no dizziness  I reviewed pt's medications, allergies, PMH, social hx, family hx, and changes were documented in  the history of present illness. Otherwise, unchanged from my initial visit note.  Past surgical history: - Ear tubes as a child  Social History   Social History  . Marital Status: Single    Spouse Name: N/A  . Number of Children: 0   Occupational History  .  student at [employer]   Social History Main Topics  . Smoking status: Never Smoker   . Smokeless  tobacco: Current User  . Alcohol Use: No  . Drug Use: No   Social History Narrative  No Known Allergies   Current Outpatient Medications  Medication Sig Dispense Refill  . cephALEXin (KEFLEX) 500 MG capsule Take 1 capsule (500 mg total) by mouth 3 (three) times daily. (Patient not taking: Reported on 03/23/2018) 20 capsule 0  . diclofenac (VOLTAREN) 50 MG EC tablet Take 1 tablet (50 mg total) by mouth 2 (two) times daily. (Patient not taking: Reported on 03/23/2018) 15 tablet 0  . HYDROcodone-acetaminophen (NORCO) 5-325 MG tablet Take 1 tablet by mouth every 6 (six) hours as needed. (Patient not taking: Reported on 03/23/2018) 15 tablet 0  . LORazepam (ATIVAN) 0.5 MG tablet Take 1 tablet (0.5 mg total) by mouth 2 (two) times daily as needed for anxiety. (Patient not taking: Reported on 03/23/2018) 4 tablet 0  . norgestimate-ethinyl estradiol (ORTHO-CYCLEN,SPRINTEC,PREVIFEM) 0.25-35 MG-MCG tablet     . TRI-SPRINTEC 0.18/0.215/0.25 MG-35 MCG tablet TK 1 T PO D UTD  3   No current facility-administered medications for this visit.    Family history: - Skin cancer in grandmother, otherwise see history of present illness   PE: BP 100/60   Pulse 75   Ht 5' 4.5" (1.638 m)   Wt 134 lb (60.8 kg)   SpO2 98%   BMI 22.65 kg/m  Wt Readings from Last 3 Encounters:  03/24/19 134 lb (60.8 kg)  03/23/18 136 lb (61.7 kg)  03/20/17 133 lb (60.3 kg)   Constitutional: Normal weight, in NAD Eyes: PERRLA, EOMI, no exophthalmos ENT: moist mucous membranes, + right thyroid fullness, visible right thyroid nodule when she swallows, no cervical lymphadenopathy Cardiovascular: RRR, No MRG Respiratory: CTA B Gastrointestinal: abdomen soft, NT, ND, BS+ Musculoskeletal: no deformities, strength intact in all 4 Skin: moist, warm, no rashes Neurological: no tremor with outstretched hands, DTR normal in all 4  ASSESSMENT: 1. Right thyroid nodule   2. Hashimoto's thyroiditis   PLAN: 1. Right thyroid  nodule  -We reviewed together the report of her imaging tests: Ultrasounds, insisting on the last one from 2019.  Her right thyroid nodule appears stable in size and without worrisome characteristics.  She has a spongiform nodule in the left mid gland, and by definition, these are benign.  Right subcentimeter nodules are also present, not worrisome.  These do not need follow-up. -She does not have neck compression symptoms and does not feel her right thyroid nodule anymore -No need to repeat the ultrasound for now, I plan to repeat another one in 2 to 3 years.  2.  Hashimoto thyroiditis -This was first detected as heterogeneity on thyroid ultrasound and then TPO antibodies were found to be elevated -She appears euthyroid, with no complaints, and her latest TFTs were normal 1 year ago -At this visit, we will recheck her TFTs -No intervention is needed if these are normal -We discussed that if she is planning to get pregnant, we will need to recheck her TFTs right away and may need to start levothyroxine for a target TSH lower than 2.5 -  I will see her back in a year  Office Visit on 03/24/2019  Component Date Value Ref Range Status  . TSH 03/24/2019 1.09  0.35 - 4.50 uIU/mL Final  . Free T4 03/24/2019 0.80  0.60 - 1.60 ng/dL Final   Comment: Specimens from patients who are undergoing biotin therapy and /or ingesting biotin supplements may contain high levels of biotin.  The higher biotin concentration in these specimens interferes with this Free T4 assay.  Specimens that contain high levels  of biotin may cause false high results for this Free T4 assay.  Please interpret results in light of the total clinical presentation of the patient.    . T3, Free 03/24/2019 3.0  2.3 - 4.2 pg/mL Final   TFts are normal.  Philemon Kingdom, MD PhD [facility]

## 2019-03-26 LAB — T4, FREE: Free T4: 0.8 ng/dL (ref 0.60–1.60)

## 2019-03-26 LAB — TSH: TSH: 1.09 u[IU]/mL (ref 0.35–4.50)

## 2019-03-26 LAB — T3, FREE: T3, Free: 3 pg/mL (ref 2.3–4.2)

## 2019-04-09 DIAGNOSIS — H7291 Unspecified perforation of tympanic membrane, right ear: Secondary | ICD-10-CM | POA: Diagnosis not present

## 2019-04-09 DIAGNOSIS — H9209 Otalgia, unspecified ear: Secondary | ICD-10-CM | POA: Diagnosis not present

## 2019-04-12 DIAGNOSIS — H66011 Acute suppurative otitis media with spontaneous rupture of ear drum, right ear: Secondary | ICD-10-CM | POA: Diagnosis not present

## 2019-04-12 DIAGNOSIS — H7291 Unspecified perforation of tympanic membrane, right ear: Secondary | ICD-10-CM | POA: Diagnosis not present

## 2019-04-12 DIAGNOSIS — H9203 Otalgia, bilateral: Secondary | ICD-10-CM | POA: Diagnosis not present

## 2019-04-12 DIAGNOSIS — H9011 Conductive hearing loss, unilateral, right ear, with unrestricted hearing on the contralateral side: Secondary | ICD-10-CM | POA: Diagnosis not present

## 2019-04-13 DIAGNOSIS — Z6823 Body mass index (BMI) 23.0-23.9, adult: Secondary | ICD-10-CM | POA: Diagnosis not present

## 2019-04-13 DIAGNOSIS — Z20828 Contact with and (suspected) exposure to other viral communicable diseases: Secondary | ICD-10-CM | POA: Diagnosis not present

## 2019-04-20 DIAGNOSIS — H66011 Acute suppurative otitis media with spontaneous rupture of ear drum, right ear: Secondary | ICD-10-CM | POA: Diagnosis not present
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
## 2019-06-25 DIAGNOSIS — Z01419 Encounter for gynecological examination (general) (routine) without abnormal findings: Secondary | ICD-10-CM | POA: Diagnosis not present

## 2019-06-25 DIAGNOSIS — Z113 Encounter for screening for infections with a predominantly sexual mode of transmission: Secondary | ICD-10-CM | POA: Diagnosis not present

## 2019-06-25 DIAGNOSIS — Z6822 Body mass index (BMI) 22.0-22.9, adult: Secondary | ICD-10-CM | POA: Diagnosis not present

## 2019-12-31 DIAGNOSIS — Z20822 Contact with and (suspected) exposure to covid-19: Secondary | ICD-10-CM | POA: Diagnosis not present

## 2019-12-31 DIAGNOSIS — Z03818 Encounter for observation for suspected exposure to other biological agents ruled out: Secondary | ICD-10-CM | POA: Diagnosis not present

## 2020-03-29 ENCOUNTER — Encounter: Payer: Self-pay | Admitting: Internal Medicine

## 2020-03-29 ENCOUNTER — Ambulatory Visit (INDEPENDENT_AMBULATORY_CARE_PROVIDER_SITE_OTHER): Payer: BC Managed Care – PPO | Admitting: Internal Medicine

## 2020-03-29 ENCOUNTER — Other Ambulatory Visit: Payer: Self-pay

## 2020-03-29 VITALS — BP 98/60 | HR 73 | Ht 64.5 in | Wt 122.6 lb

## 2020-03-29 DIAGNOSIS — E063 Autoimmune thyroiditis: Secondary | ICD-10-CM | POA: Diagnosis not present

## 2020-03-29 DIAGNOSIS — E041 Nontoxic single thyroid nodule: Secondary | ICD-10-CM | POA: Diagnosis not present

## 2020-03-29 LAB — T4, FREE: Free T4: 1 ng/dL (ref 0.60–1.60)

## 2020-03-29 LAB — T3, FREE: T3, Free: 2.7 pg/mL (ref 2.3–4.2)

## 2020-03-29 LAB — TSH: TSH: 1.3 u[IU]/mL (ref 0.35–4.50)

## 2020-03-29 NOTE — Patient Instructions (Signed)
Please stop at the lab.  Please come back for a follow-up appointment in 1 year.  
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
## 2020-04-24 IMAGING — US US THYROID
1 series · 13 of 25 positions shown · non-contrast
Comparison: Prior thyroid ultrasound 03/07/2015 and 01/05/2014

CLINICAL DATA: Goiter. 22-year-old female with a history of thyroid
nodules. She has previously undergone biopsy the right-sided thyroid
nodule on 04/19/2014

EXAM:
THYROID ULTRASOUND
TECHNIQUE: Ultrasound examination of the thyroid gland and adjacent soft
tissues was performed.

[Series 1: us thyroid · 0.08mm/px · 13 of 54 slices shown]
[im 1/54]
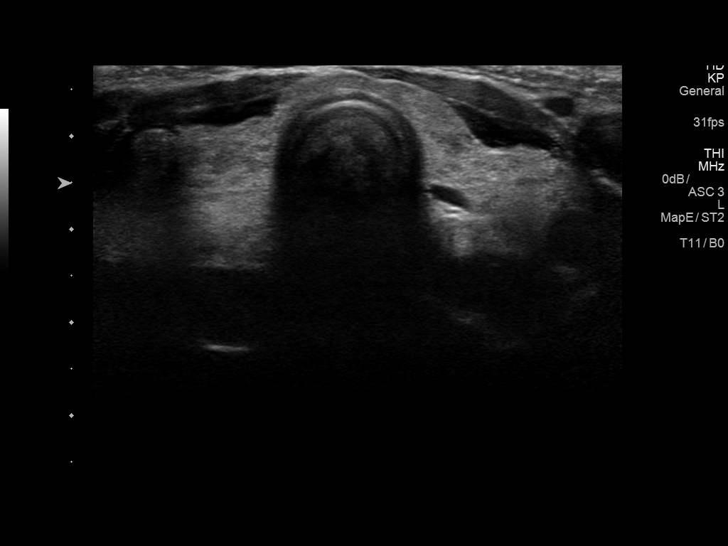
[im 5/54]
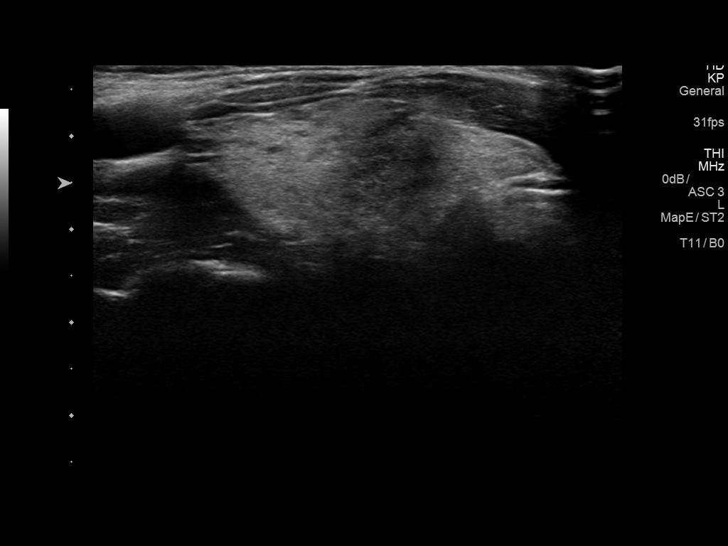
[im 9/54]
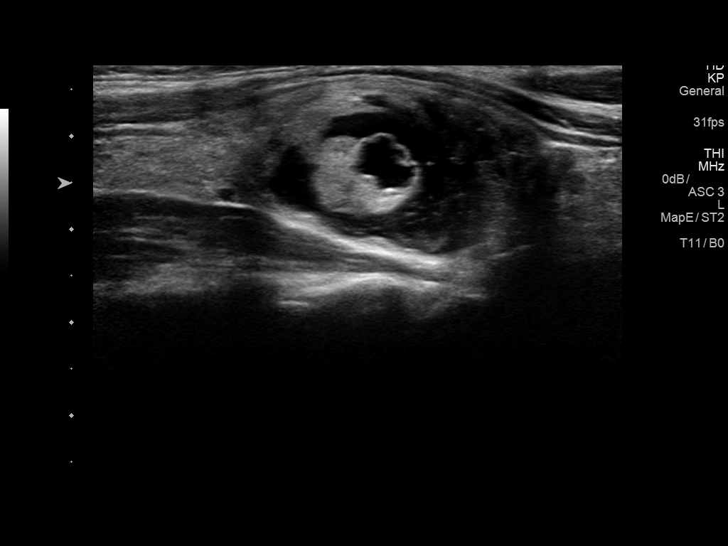
[im 14/54]
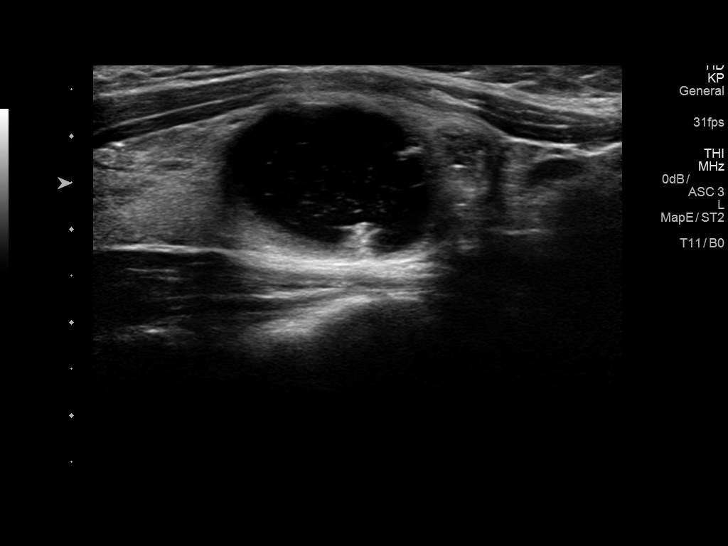
[im 18/54]
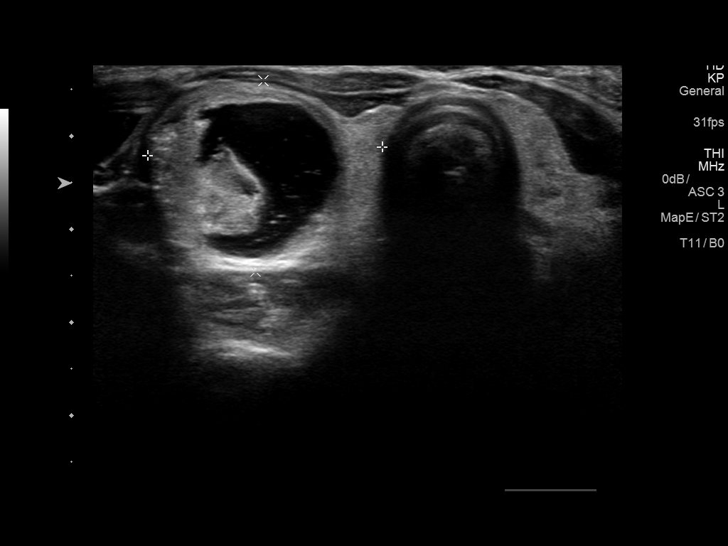
[im 23/54]
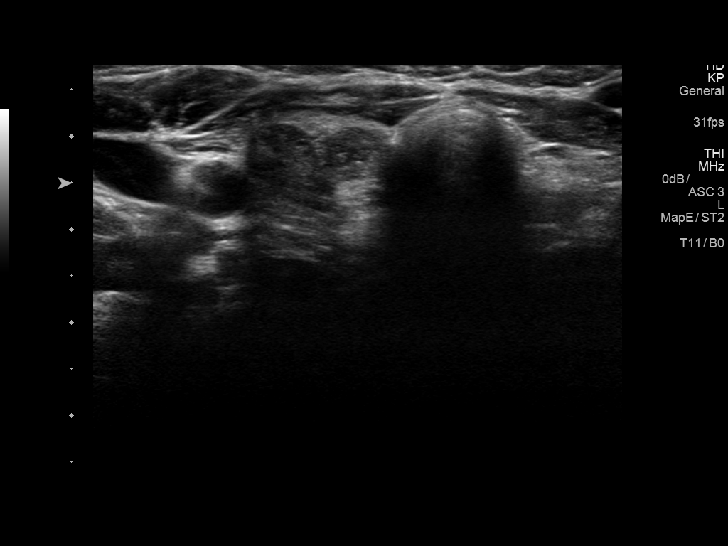
[im 27/54]
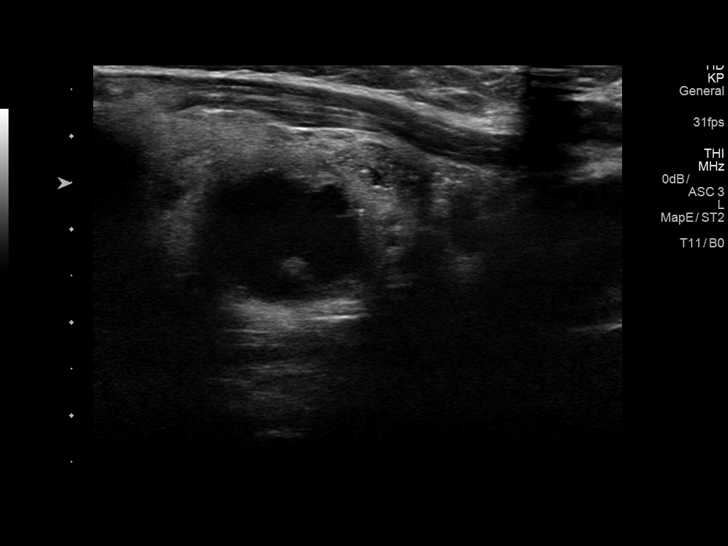
[im 31/54]
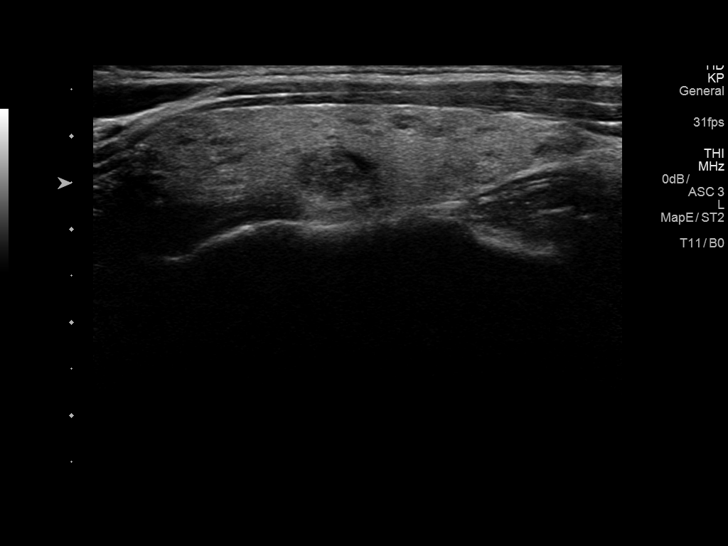
[im 36/54]
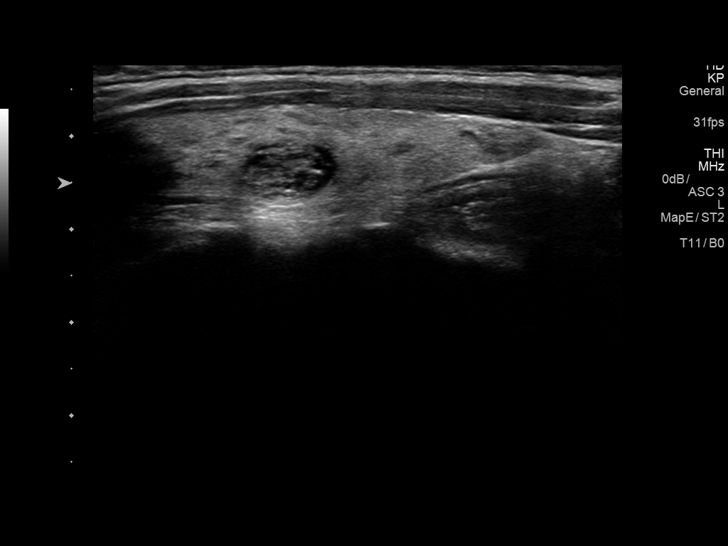
[im 40/54]
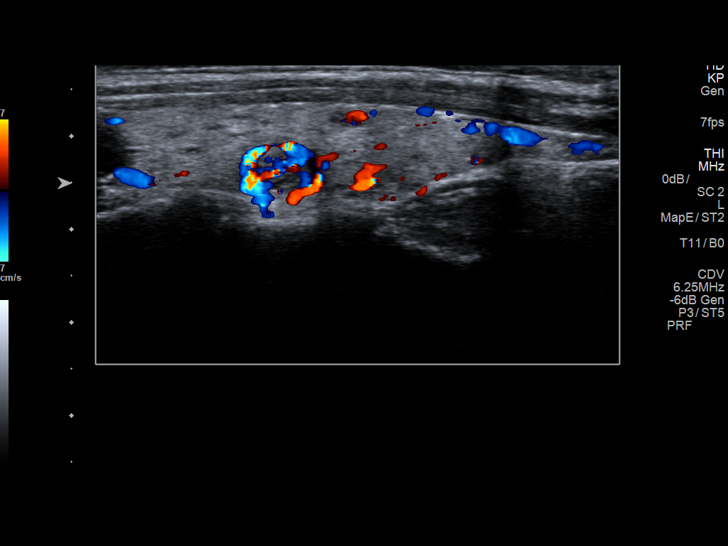
[im 45/54]
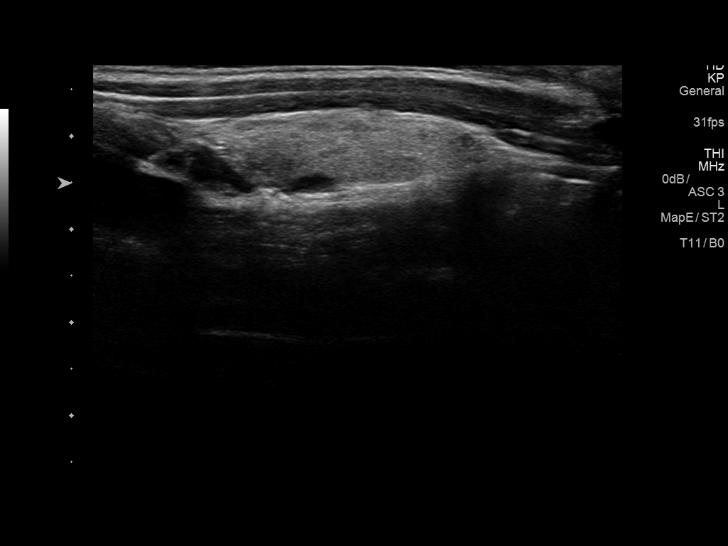
[im 49/54]
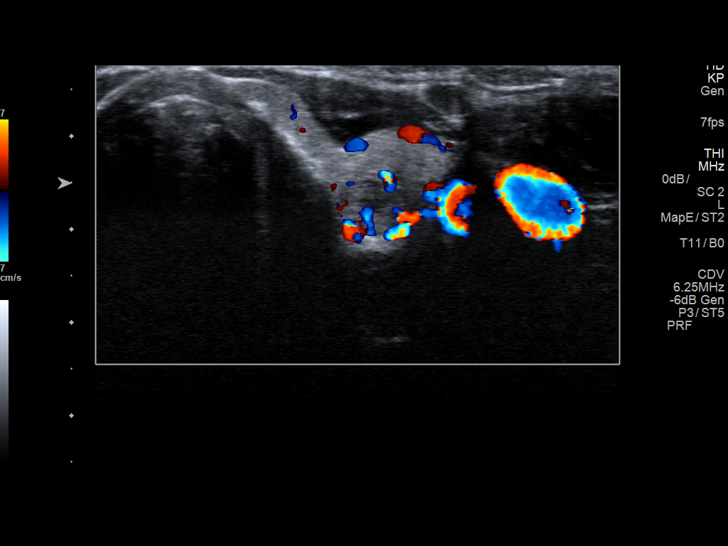
[im 54/54]
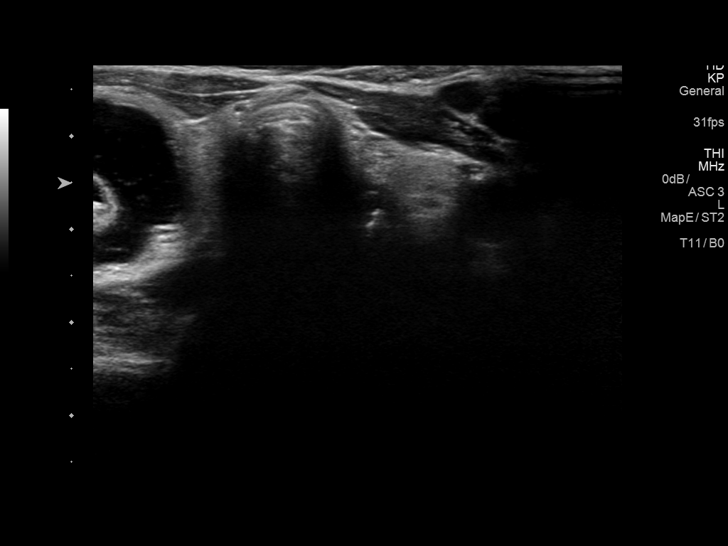

[13 of 25 positions shown; findings below may reference images not displayed]

FINDINGS: Parenchymal Echotexture: Mildly heterogenous

Isthmus: 0.3 cm

Right lobe: 5.8 x 2.1 x 2.5 cm

Left lobe: 5.1 x 1.1 x 1.3 cm

_________________________________________________________

Estimated total number of nodules >/= 1 cm: 2

Number of spongiform nodules >/=  2 cm not described below (TR1): 0

Number of mixed cystic and solid nodules >/= 1.5 cm not described
below (TR2): 0

_________________________________________________________

The previously biopsied nodule in the right inferior gland is
essentially unchanged at 2.8 x 1.9 x 2.2 cm compared to 2.5 x 1.4 x
1.6 cm in Wednesday December, 2013 and 2.7 x 1.9 x 2.0 cm in Wednesday February, 2015.

Additional benign spongiform nodule present in the left mid gland.
Small subcentimeter abutting thyroid nodules are present in the
right inferior gland just below the previously biopsied nodule.
IMPRESSION: Stable previously biopsied nodule in the right inferior gland.
Recommend correlation with prior biopsy results.

Additional nodules in the left mid and right inferior gland do not
require further follow-up.

The above is in keeping with the ACR TI-RADS recommendations - [HOSPITAL] 8668;[DATE].

## 2020-06-19 DIAGNOSIS — N390 Urinary tract infection, site not specified: Secondary | ICD-10-CM | POA: Diagnosis not present

## 2020-06-19 DIAGNOSIS — R509 Fever, unspecified: Secondary | ICD-10-CM | POA: Diagnosis not present

## 2020-06-19 DIAGNOSIS — U071 COVID-19: Secondary | ICD-10-CM | POA: Diagnosis not present

## 2020-06-19 DIAGNOSIS — R202 Paresthesia of skin: Secondary | ICD-10-CM | POA: Diagnosis not present

## 2021-04-03 ENCOUNTER — Ambulatory Visit: Payer: BC Managed Care – PPO | Admitting: Internal Medicine

## 2021-05-17 DIAGNOSIS — Z20822 Contact with and (suspected) exposure to covid-19: Secondary | ICD-10-CM | POA: Diagnosis not present

## 2021-05-17 DIAGNOSIS — J988 Other specified respiratory disorders: Secondary | ICD-10-CM | POA: Diagnosis not present

## 2021-05-17 DIAGNOSIS — R059 Cough, unspecified: Secondary | ICD-10-CM | POA: Diagnosis not present

## 2023-06-13 ENCOUNTER — Ambulatory Visit: Payer: BC Managed Care – PPO | Admitting: Internal Medicine

## 2023-06-27 ENCOUNTER — Ambulatory Visit: Payer: BC Managed Care – PPO | Admitting: Internal Medicine
